# Patient Record
Sex: Male | Born: 1965 | Race: Black or African American | Hispanic: No | Marital: Married | Smoking: Never smoker
Health system: Southern US, Community
[De-identification: ages and names within clinical notes are randomized; demographics above are authoritative.]

## PROBLEM LIST (undated history)

## (undated) DIAGNOSIS — I10 Essential (primary) hypertension: Secondary | ICD-10-CM

---

## 2009-05-15 ENCOUNTER — Emergency Department: Payer: Self-pay | Admitting: Emergency Medicine

## 2010-07-25 ENCOUNTER — Emergency Department: Payer: Self-pay | Admitting: Internal Medicine

## 2010-08-03 ENCOUNTER — Emergency Department: Payer: Self-pay | Admitting: Emergency Medicine

## 2011-01-27 ENCOUNTER — Ambulatory Visit: Payer: Self-pay

## 2011-08-11 ENCOUNTER — Ambulatory Visit: Payer: Self-pay | Admitting: Internal Medicine

## 2019-04-11 ENCOUNTER — Other Ambulatory Visit: Payer: Self-pay

## 2019-04-11 ENCOUNTER — Emergency Department
Admission: EM | Admit: 2019-04-11 | Discharge: 2019-04-11 | Disposition: A | Discharge status: Home / Self Care | Payer: No Typology Code available for payment source | Attending: Emergency Medicine | Admitting: Emergency Medicine

## 2019-04-11 ENCOUNTER — Encounter: Payer: Self-pay | Admitting: Emergency Medicine

## 2019-04-11 ENCOUNTER — Emergency Department: Payer: No Typology Code available for payment source

## 2019-04-11 DIAGNOSIS — I10 Essential (primary) hypertension: Secondary | ICD-10-CM | POA: Diagnosis not present

## 2019-04-11 DIAGNOSIS — Z79899 Other long term (current) drug therapy: Secondary | ICD-10-CM | POA: Insufficient documentation

## 2019-04-11 DIAGNOSIS — Y999 Unspecified external cause status: Secondary | ICD-10-CM | POA: Diagnosis not present

## 2019-04-11 DIAGNOSIS — Y92414 Local residential or business street as the place of occurrence of the external cause: Secondary | ICD-10-CM | POA: Diagnosis not present

## 2019-04-11 DIAGNOSIS — Y9389 Activity, other specified: Secondary | ICD-10-CM | POA: Insufficient documentation

## 2019-04-11 DIAGNOSIS — M5489 Other dorsalgia: Secondary | ICD-10-CM | POA: Insufficient documentation

## 2019-04-11 HISTORY — DX: Essential (primary) hypertension: I10

## 2019-04-11 MED ORDER — CYCLOBENZAPRINE HCL 5 MG PO TABS: ORAL_TABLET | ORAL | 0 refills | Status: AC

## 2019-04-11 MED ORDER — OXYCODONE-ACETAMINOPHEN 5-325 MG PO TABS
1.0000 | ORAL_TABLET | Freq: Once | ORAL | Status: AC
Start: 2019-04-11 — End: 2019-04-11
  Administered 2019-04-11: 1 via ORAL
  Filled 2019-04-11: qty 1

## 2019-04-11 MED ORDER — IBUPROFEN 600 MG PO TABS: 600.0000 mg | ORAL_TABLET | Freq: Four times a day (QID) | ORAL | 0 refills | Status: AC | PRN
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
## 2019-04-11 NOTE — ED Notes (Signed)
Pt ambulatory with a steady gait to X-ray.
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
## 2021-02-12 IMAGING — CR DG CHEST 2V
1 series · 2 of 2 positions shown · non-contrast
Comparison: None.

CLINICAL DATA: Upper back pain

EXAM:
CHEST - 2 VIEW

[Series 1: dg chest 2 view · 0.14mm/px · 2 of 2 slices shown]
[im 1/2]
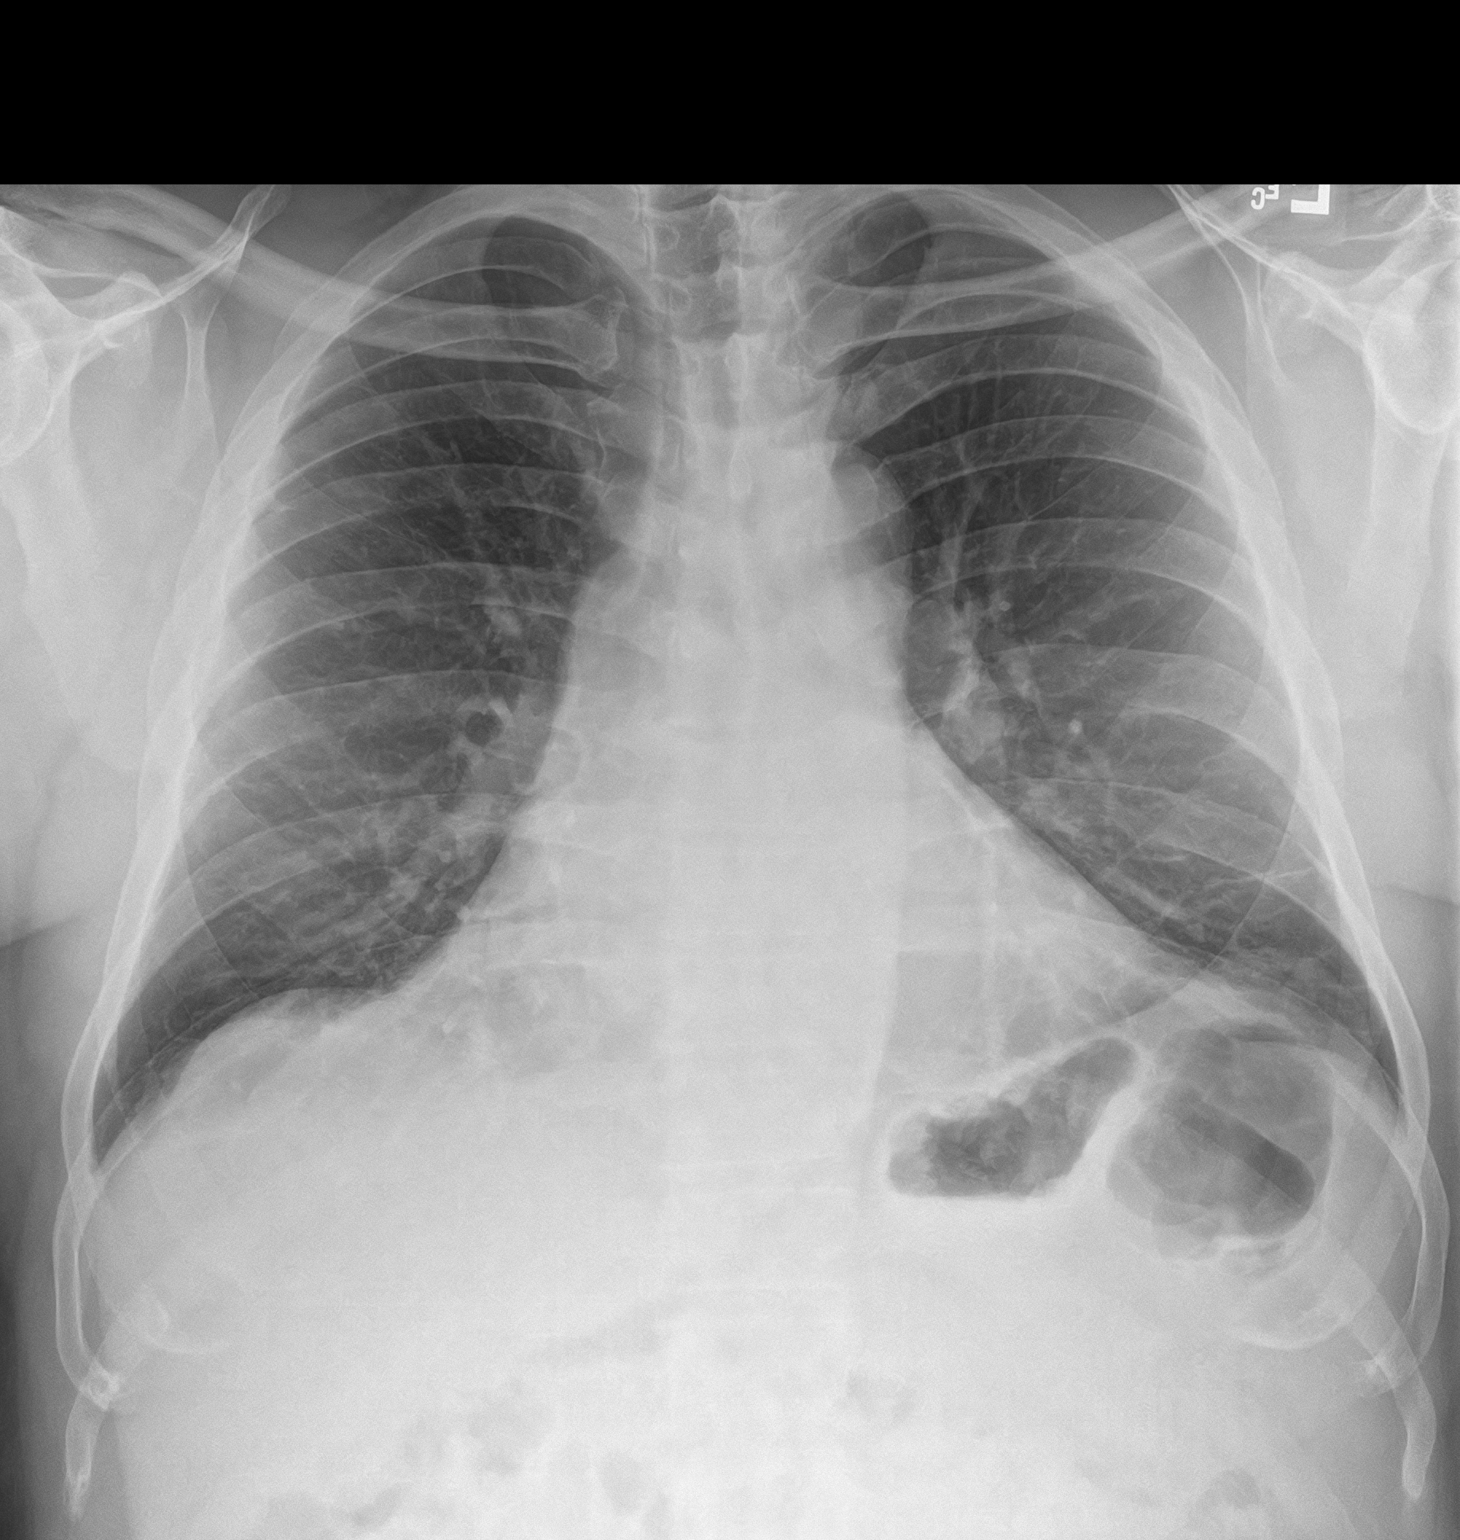
[im 2/2]
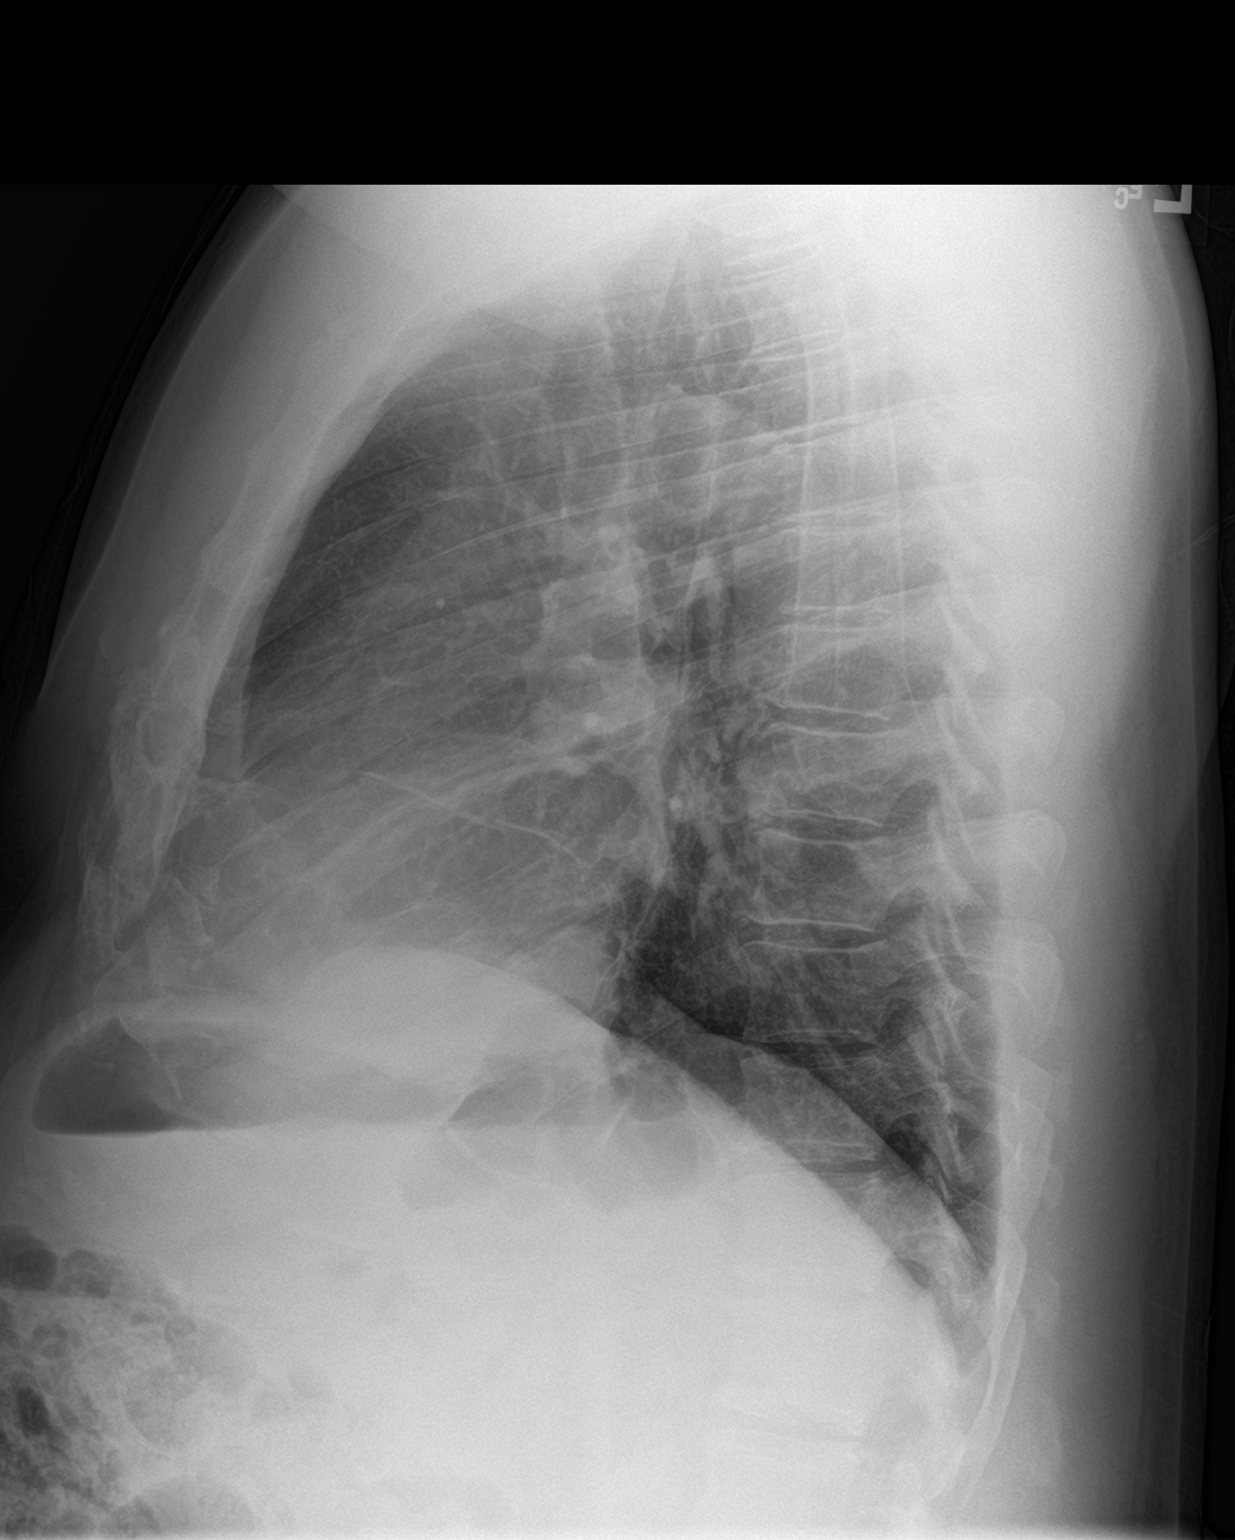

[2 of 2 positions shown; findings below may reference images not displayed]

FINDINGS: Cardiomegaly. Bandlike atelectasis or scarring of the left lung
base. The visualized skeletal structures are unremarkable.
IMPRESSION: Cardiomegaly without acute abnormality of the lungs.
